# Patient Record
Sex: Female | Born: 1980 | Race: White | Hispanic: No | Smoking: Current some day smoker
Health system: Southern US, Community
[De-identification: ages and names within clinical notes are randomized; demographics above are authoritative.]

## PROBLEM LIST (undated history)

## (undated) DIAGNOSIS — F419 Anxiety disorder, unspecified: Secondary | ICD-10-CM

## (undated) HISTORY — DX: Anxiety disorder, unspecified: F41.9

---

## 2003-10-07 ENCOUNTER — Emergency Department (HOSPITAL_COMMUNITY): Admission: EM | Admit: 2003-10-07 | Discharge: 2003-10-07 | Payer: Self-pay | Admitting: Emergency Medicine

## 2005-12-14 ENCOUNTER — Other Ambulatory Visit: Admission: RE | Admit: 2005-12-14 | Discharge: 2005-12-14 | Payer: Self-pay | Admitting: Obstetrics and Gynecology

## 2006-04-11 ENCOUNTER — Inpatient Hospital Stay (HOSPITAL_COMMUNITY): Admission: AD | Admit: 2006-04-11 | Discharge: 2006-04-11 | Payer: Self-pay | Admitting: Obstetrics and Gynecology

## 2006-06-25 ENCOUNTER — Inpatient Hospital Stay (HOSPITAL_COMMUNITY): Admission: AD | Admit: 2006-06-25 | Discharge: 2006-06-28 | Payer: Self-pay | Admitting: Obstetrics and Gynecology

## 2006-07-04 ENCOUNTER — Encounter: Admission: RE | Admit: 2006-07-04 | Discharge: 2006-08-03 | Payer: Self-pay | Admitting: Obstetrics and Gynecology

## 2006-08-03 ENCOUNTER — Other Ambulatory Visit: Admission: RE | Admit: 2006-08-03 | Discharge: 2006-08-03 | Payer: Self-pay | Admitting: Obstetrics and Gynecology

## 2006-08-04 ENCOUNTER — Encounter: Admission: RE | Admit: 2006-08-04 | Discharge: 2006-08-12 | Payer: Self-pay | Admitting: Obstetrics and Gynecology

## 2006-10-27 ENCOUNTER — Emergency Department (HOSPITAL_COMMUNITY): Admission: EM | Admit: 2006-10-27 | Discharge: 2006-10-27 | Payer: Self-pay | Admitting: Emergency Medicine

## 2008-02-14 ENCOUNTER — Inpatient Hospital Stay (HOSPITAL_COMMUNITY): Admission: AD | Admit: 2008-02-14 | Discharge: 2008-02-14 | Payer: Self-pay | Admitting: Obstetrics and Gynecology

## 2008-06-01 ENCOUNTER — Inpatient Hospital Stay (HOSPITAL_COMMUNITY): Admission: AD | Admit: 2008-06-01 | Discharge: 2008-06-01 | Payer: Self-pay | Admitting: Obstetrics and Gynecology

## 2008-07-09 ENCOUNTER — Inpatient Hospital Stay (HOSPITAL_COMMUNITY): Admission: RE | Admit: 2008-07-09 | Discharge: 2008-07-11 | Payer: Self-pay | Admitting: Obstetrics and Gynecology

## 2008-07-12 ENCOUNTER — Encounter: Admission: RE | Admit: 2008-07-12 | Discharge: 2008-08-11 | Payer: Self-pay | Admitting: Obstetrics and Gynecology

## 2008-08-12 ENCOUNTER — Encounter: Admission: RE | Admit: 2008-08-12 | Discharge: 2008-09-10 | Payer: Self-pay | Admitting: Obstetrics and Gynecology

## 2010-03-03 LAB — HM PAP SMEAR

## 2011-02-15 ENCOUNTER — Encounter: Payer: Self-pay | Admitting: Internal Medicine

## 2011-02-15 ENCOUNTER — Ambulatory Visit (INDEPENDENT_AMBULATORY_CARE_PROVIDER_SITE_OTHER): Payer: 59 | Admitting: Internal Medicine

## 2011-02-15 VITALS — BP 130/80 | HR 72 | Ht 66.0 in | Wt 194.0 lb

## 2011-02-15 DIAGNOSIS — R41840 Attention and concentration deficit: Secondary | ICD-10-CM

## 2011-02-15 DIAGNOSIS — F17200 Nicotine dependence, unspecified, uncomplicated: Secondary | ICD-10-CM

## 2011-02-15 DIAGNOSIS — Z720 Tobacco use: Secondary | ICD-10-CM

## 2011-02-15 DIAGNOSIS — F411 Generalized anxiety disorder: Secondary | ICD-10-CM

## 2011-02-15 DIAGNOSIS — F419 Anxiety disorder, unspecified: Secondary | ICD-10-CM | POA: Insufficient documentation

## 2011-02-15 DIAGNOSIS — R6889 Other general symptoms and signs: Secondary | ICD-10-CM

## 2011-02-15 DIAGNOSIS — F4322 Adjustment disorder with anxiety: Secondary | ICD-10-CM

## 2011-02-15 DIAGNOSIS — Z23 Encounter for immunization: Secondary | ICD-10-CM

## 2011-02-15 NOTE — Progress Notes (Signed)
Subjective:    Patient ID: Tara Thornton, female    DOB: 05/28/1981, 30 y.o.   MRN: [id number]  HPI Patient comes in today for  New patient visit. orig from  [place]  nd here for 10 years.  Divorced  March 2012sep Feb 2011  Is concerned about anxiety and concentration issues. It has gotten so bad that the people at work have suggested that she get some evaluation.  Anxiety/ fatigue hard to sleep and cant focus and  Losing things and distracted  .    Not  Productive ; Usually gets a lot done and  Now cant focus.   Noticing this the worse in the past 3-4 month always  has had this tendency and now interfering.  Did an on line test And  Showed adhd.  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 Tends to do better in a crisis  then on routine things.   Has been on a d prescription for  Anxiety/   cymbalta last  Year when she went to [facility]   [facility]  ? Dr Hyacinth Meeker one time. Had a hard time adapting and had " Vomited evey day "and gave her dry mouth   Eventually this  resolve and then helped   Her anxiety but had to stop for cost reasons.after   6-8 weeks.  She didn't go back because her insurance wouldn't pay for it.    Didn't go back and took for 2 months and had se with this.    And couldn't afford so stopped taking it .  No depression sx.  School :   Hard at [employer]  Didn't do well there was  for [employer].  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   Contraception : And weight gain and 30  implanon  And out for 2 weeks.  Had  Lab tests.  Done  including thyroid tests that were apparently normal..  Usually functions better under stress  And harder challenge    Best friend . died. A few months ago from  Leukemia   38 . Hospice   A first major loss but coping and open about this. Family history:   Mom depression   Mom hx of etoh and drug abuse when younger  MGM  etoh and  PgM  Not normal. .   Sis has been on  meds for depression and then put on VYvanse and wellbutrin with help   .  MGF ? Mental illness   .Father had ?   LD.   W As in [employer].   Pharmacist, community but  Had a hard time in school. "   Review of Systems Negative chest pain shortness of breath fever vision hearing change. New joint pains did strain her left foot on an elliptical machine recently. No bruising bleeding unusual headaches vision changes. Neg CV PULM Gi GU sx  Currently. Rest of ros negative.    Objective:   Physical Exam Well-developed well-nourished in no acute distress speaks a bit rapidly coherent articulate sometimes distractible no excess motor movement or tremor HEENT: Normocephalic ;atraumatic , Eyes;  PERRL, EOMs  Full, lids and conjunctiva clear,,Ears: no deformities, canals nl, TM landmarks normal, Nose: no deformity or discharge  Mouth : OP clear without lesion or edema . Chest:  Clear to A&P without wheezes rales or rhonchi CV:  S1-S2 no  gallops or murmurs peripheral perfusion is normal Neck supple no mass or LN Abdomen:  Sof,t normal bowel sounds without hepatosplenomegaly, no guarding rebound or masses no CVA tenderness MSNo clubbing cyanosis or edema   slight swelling behind left foot. No bony tenderness Neuro: oriented x 3   Cn intact dtrs nl, nl gait  Skin clear no unusual rashes      Assessment & Plan:  Focusing problem Multi causes .  Could have underlying ADD predating the other events but very many external factors contributing. Difficult to decide which medication treatment plan would be best for her. Discussed this with her. She feels a bit frustrated because she has so little time  She goes to med care infrequently  and people have  encouraged her to get intervention help. Wants to avoid weight gaining meds also.  Has a fam hx of depression and poss add( sister)  ? Not dx in others . No subs abuse contributing  Sleep deprivation ,  2 small children and hectic schedule  External factors   Contribute also Anxiety reactive.  Denies depressive sx .  Disc grieving model contributing also but seems appropriate for situation. Get copy of labs sent. Tobacco minimal  :rec dc  HCM tdap today   Updated.  Consider get varicella serology with next blood tests.    Contacted Dr Dub Mikes office and they can do entry appt  In 2 days on Thursday this week. Encouraged to follow through  Despite her schedule.    Total visit > 50% spent counseling and coordinating care

## 2011-02-15 NOTE — H&P (Signed)
Tara Thornton, Tara Thornton           ACCOUNT NO.:  [id number]   MEDICAL RECORD NO.:  [id number]          PATIENT TYPE:  MAT   LOCATION:  [facility]                          FACILITY:  [facility]   PHYSICIAN:  Crist Fat. Rivard, M.D. DATE OF BIRTH:  01/17/1981   DATE OF ADMISSION:  06/01/2008  DATE OF DISCHARGE:  06/01/2008                              HISTORY & PHYSICAL   Ms. Tara Thornton is a 30 year old gravida 2, para 1-0-0-1 at 40 weeks who  presents for induction secondary to history of LGA infant and severe low  back and leg pain.  Pregnancy has been remarkable for:  1. First trimester spotting.  2. Rh negative.  3. Questionable LMP.  4. History of LGA infant last pregnancy.  5. Group B strep negative.   PRENATAL LABS:  Blood type is O negative, Rh antibody negative, urine  nonreactive, rubella titer positive, hepatitis B surface antigen  negative, HIV was nonreactive, cystic fibrosis testing was negative.  Pap was done in March and was within normal limits.  The patient had a  first trimester screen that was normal.  Hemoglobin upon entering the  practice was 13.4.  It was within normal limits at 27 weeks.  The  patient had a Glucola at 19 weeks that was normal.  She had a repeat at  29 weeks that was also negative.  Group B strep culture was negative at  36 weeks.   HISTORY OF PRESENT PREGNANCY:  The patient entered care at approximately  10 weeks.  She had an ultrasound that day for dating which gave an [facility]  of July 09, 2008.  She planned first trimester screening.  She did  have some first trimester spotting and received Rhophylac at  approximately 10 weeks.  She had an early Glucola at 19 weeks secondary  to history of LGA infant.  This was normal and 113.  She had another  ultrasound at 19 weeks showing normal growth and normal cervical length.  She began to have some sciatica 23 weeks.  She had another Glucola at 27  weeks that was normal at 105.  She received a prescription for  RhoGAM at  that time.  At 35 weeks she was of having more back pain.  She was  placed on Ambien for comfort.  By 37 weeks, she was having more  persistent low back pain and leg pain with numbness of legs and edema.  There was minimal improvement with physical therapy.  She was taken out  of work at that time.  Ultrasound showed a fetus vertex with 7 pounds at  78th percentile.  Cervix at that time was closed 50% vertex, -2.  She  was placed on Tylenol #3.  At 38 weeks, she was continuing to have the  same issues with back and leg pain.  Group B strep was negative.  No  change in her cervix.  Plan was made for induction on July 09, 2008.   OBSTETRICAL HISTORY:  In 2007, she had a vaginal birth of a female infant  weight 9 pounds 9 ounces at 40-5/7 weeks.  She was in labor 34  hours.  She had epidural anesthesia.  She was induced for that delivery.  She  did receive RhoGAM during that pregnancy.   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  She had a  colposcopy in April 2007.  Paps have been normal since then.  She had  been previously on Mircette.  She reports usual childhood illnesses.  She has a history of 1 UTI in the past.   SURGICAL HISTORY:  Includes wisdom teeth removed in 2002.  Her only  other hospitalization was for childbirth.   ALLERGIES:  SEAFOOD, which causes rash and vomiting.   FAMILY HISTORY:  Mother and father have hypertension.  Her mother and  sister have varicosities.  Her maternal grandmother had lung cancer.  Her mother had thyroid problems.  Maternal grandmother had lung cancer.  There is depression in the family.  Her mother and sister are smokers.   GENETIC HISTORY:  Remarkable for the patient's maternal grandmother is a  twin.   SOCIAL HISTORY:  The patient is single.  The father of the baby is  involved and supportive.  His name is Ariell Gunnels.  The patient has 3  years of college.  She is employed in Engineering geologist.  Her partner has a  Systems analyst.  He is a IT sales professional.  She has been followed by the Physician  Service at [facility].  She denies any alcohol, drug or tobacco  use during this pregnancy.  She is Caucasian and of [place] faith.   PHYSICAL EXAM:  VITAL SIGNS:  Stable.  The patient is febrile.  HEENT:  Within normal limits.  LUNGS:  Breath sounds are clear.  HEART:  Regular rate and rhythm without murmur.  BREASTS:  Soft and nontender.  ABDOMEN:  Fundal height is approximately 38 cm.  Estimated fetal weight  is 7-8 pounds.  Uterine contractions are occasional and mild per patient  report.  PELVIC:  Exam is deferred at present.  Fetal heart rate is in the 140s  by Doppler.  EXTREMITIES:  Deep tendon reflexes are 2+ without clonus.  There is  trace edema noted.   IMPRESSION:  1. Intrauterine pregnancy at 40 weeks.  2. Severe sciatica and back pain.  3. Negative group B strep.  4. History of large-for-gestational-age infant.   PLAN:  1. Admit to birthing suite for consult with Dr. Silverio Lay as      attending physician.  2. Routine physician induction orders per Dr. Estanislado Pandy.  3. Pain medication p.r.n.      Renaldo Reel Emilee Hero, C.N.M.      Crist Fat Rivard, M.D.  Electronically Signed    VLL/MEDQ  D:  07/08/2008  T:  07/08/2008  Job:  [id number]

## 2011-02-15 NOTE — Patient Instructions (Addendum)
Sleep deprivation Stress Grief anxiety can easily interfere with short term memory.    Refer  For psych eval asap for med management  Some ADHD meds make anxiety worse. Keep appt with Dr Ree Shay office .

## 2011-07-04 LAB — CBC
HCT: 31.1 — ABNORMAL LOW
Hemoglobin: 10.3 — ABNORMAL LOW
Hemoglobin: 9.5 — ABNORMAL LOW
MCHC: 33.2
Platelets: 242
RBC: 3.27 — ABNORMAL LOW
RBC: 3.59 — ABNORMAL LOW
RDW: 13.9
WBC: 10.3
WBC: 13.2 — ABNORMAL HIGH

## 2011-07-04 LAB — RPR: RPR Ser Ql: NONREACTIVE

## 2011-07-04 LAB — CCBB MATERNAL DONOR DRAW

## 2011-07-04 LAB — RH IMMUNE GLOB WKUP(>/=20WKS)(NOT WOMEN'S HOSP): Fetal Screen: NEGATIVE

## 2011-07-12 ENCOUNTER — Telehealth: Payer: Self-pay | Admitting: *Deleted

## 2011-07-12 NOTE — Telephone Encounter (Signed)
Per Dr. Fabian Sharp- need a note from md's office about diagnosis and what her treatment is before her appt. Can get refills from them until we can work her in.

## 2011-07-12 NOTE — Telephone Encounter (Signed)
Pt wants to schedule an appt before the end of the month for a follow up on anxiety, panic attacks and not sleeping at night. Pt has seen a specialist in the past but they don't take her ins. Where can we put her in and do you want 15 or 30 mins for this patient?

## 2011-07-13 NOTE — Telephone Encounter (Signed)
Left message on machine to have md fax over notes for [place] to review so we can find a spot to work her in.

## 2011-07-28 ENCOUNTER — Ambulatory Visit (INDEPENDENT_AMBULATORY_CARE_PROVIDER_SITE_OTHER): Payer: 59 | Admitting: Internal Medicine

## 2011-07-28 ENCOUNTER — Encounter: Payer: Self-pay | Admitting: Internal Medicine

## 2011-07-28 DIAGNOSIS — F411 Generalized anxiety disorder: Secondary | ICD-10-CM

## 2011-07-28 DIAGNOSIS — T887XXA Unspecified adverse effect of drug or medicament, initial encounter: Secondary | ICD-10-CM

## 2011-07-28 DIAGNOSIS — F419 Anxiety disorder, unspecified: Secondary | ICD-10-CM

## 2011-07-28 MED ORDER — BUPROPION HCL ER (XL) 150 MG PO TB24: ORAL_TABLET | ORAL | Status: DC

## 2011-07-28 NOTE — Patient Instructions (Signed)
Begin wellbutrin 150xl 1 a day for a week then increase toe 300 mg per day .  Call for refill when at end of this med and will change to 300 mg xl .  Then will plan rov in 6-8 weeks unless you are seeing Dr Ree Shay office.

## 2011-07-28 NOTE — Progress Notes (Signed)
  Subjective:    Patient ID: Tara Thornton, female    DOB: November 08, 1980, 30 y.o.   MRN: [id number]  HPI Comes in for anciwty and medication r .  See last ove.  Was seen by Alden Hipp neal in Dr Dub Mikes office  Feb 17 2011 who began lexapro . They are currently now not on her insurance panel and needs other help until they get back on panel . Had rx of 2 months of lexapro    And may have been aggravating sleep disturbance  .     Was up to 20 mg for about 6-8 weeks.   The longer she took  It the  More she  Felt  anxious wound tight and chest tightness. Fu has been delayed by scheduling  and  Insuranse issue.   Sleep is disturbed and  hard to breath.    No new sx .      Struggling with weight gain .Hard to lose weight.  Had  Weight gain with implanon.  in the past.   Rare use of  tobacco .  Etoh.  Minimal.  Coffee    [employer]   220.calories at times  Hx of wellbutrin  Use that seems to have helped her in the apst and wants to try this. Currently no acute panic attacks . Schedule is  Busy with her children and work.  Review of Systems ROS:  GEN/ HEENTNo fever,  sweats headaches vision problems hearing changes, CV/ PULM; No chest pain shortness of breath cough, syncope,edema  change in exercise tolerance. GI /GU: No adominal pain, vomiting, change in bowel habits. No blood in the stool. No significant GU symptoms. SKIN/HEME: ,no acute skin rashes suspicious lesions or bleeding. No lymphadenopathy, nodules, masses.  NEURO/ PSYCH:  No neurologic signs such as weakness numbness IMM/ Allergy: No unusual infections.  Allergy .   REST of 12 system review negative Past history family history social history reviewed in the electronic medical record. Reviewed note from Dr Ree Shay office Has had labs done with nl thyroid in the past     Objective:   Physical Exam WDWN in nad  Neck: Supple without adenopathy or masses or bruits Oriented x 3. Normal cognition, attention, speech. Notdepressed appearing    Good eye contact . mildly anxious here with small child . Appropriate interaction     Assessment & Plan:  Anxiety   Hx of side effects of meds    Disc options   Risk benefit of medication discussed.  Ok to ry wellbutrin cause of past  exposures but usually doesn't help anxiety as much as some other options.  Will follow consider a snri . Pt willing to go back to psych office if gets back in insurance network.  Total visit > 50% spent counseling and coordinating care

## 2011-07-30 ENCOUNTER — Encounter: Payer: Self-pay | Admitting: Internal Medicine

## 2011-07-30 DIAGNOSIS — T887XXA Unspecified adverse effect of drug or medicament, initial encounter: Secondary | ICD-10-CM | POA: Insufficient documentation

## 2011-09-28 ENCOUNTER — Other Ambulatory Visit: Payer: Self-pay | Admitting: Internal Medicine

## 2012-01-10 ENCOUNTER — Other Ambulatory Visit: Payer: Self-pay | Admitting: Internal Medicine

## 2012-01-10 NOTE — Telephone Encounter (Signed)
Pt last seen 07/28/11.  Rx last filled 09/28/11 #60x 1 rf.  Pls advise.

## 2012-01-10 NOTE — Telephone Encounter (Signed)
havent seen her since October. Please ask if she has gone back to psych or getting refills elsewhere.  If not can refill enough for a month and get OV before runs out.

## 2012-01-12 NOTE — Telephone Encounter (Signed)
Left a message for pt to return call 

## 2012-01-13 NOTE — Telephone Encounter (Signed)
Pt states the psych person she was seeing stopped taking her insurance and recently filled her medication for the last time.  Pt states she will make a f/u appt before medication runs out.

## 2013-02-06 ENCOUNTER — Telehealth: Payer: Self-pay | Admitting: *Deleted

## 2013-02-06 NOTE — Telephone Encounter (Signed)
Error wrong patient chart

## 2013-10-11 ENCOUNTER — Ambulatory Visit (INDEPENDENT_AMBULATORY_CARE_PROVIDER_SITE_OTHER): Payer: 59 | Admitting: Licensed Clinical Social Worker

## 2013-10-11 DIAGNOSIS — F411 Generalized anxiety disorder: Secondary | ICD-10-CM

## 2013-10-11 DIAGNOSIS — F331 Major depressive disorder, recurrent, moderate: Secondary | ICD-10-CM

## 2013-10-15 ENCOUNTER — Ambulatory Visit: Payer: 59 | Admitting: Licensed Clinical Social Worker

## 2014-08-04 ENCOUNTER — Encounter: Payer: Self-pay | Admitting: Internal Medicine

## 2016-05-13 ENCOUNTER — Other Ambulatory Visit: Payer: Self-pay | Admitting: Orthopedic Surgery

## 2016-05-13 DIAGNOSIS — M5136 Other intervertebral disc degeneration, lumbar region: Secondary | ICD-10-CM

## 2016-05-23 ENCOUNTER — Ambulatory Visit
Admission: RE | Admit: 2016-05-23 | Discharge: 2016-05-23 | Disposition: A | Discharge status: Home / Self Care | Payer: 59 | Source: Ambulatory Visit | Attending: Orthopedic Surgery | Admitting: Orthopedic Surgery

## 2016-05-23 ENCOUNTER — Ambulatory Visit
Admission: RE | Admit: 2016-05-23 | Discharge: 2016-05-23 | Disposition: A | Discharge status: Home / Self Care | Payer: Self-pay | Source: Ambulatory Visit | Attending: Orthopedic Surgery | Admitting: Orthopedic Surgery

## 2016-05-23 ENCOUNTER — Other Ambulatory Visit: Payer: Self-pay | Admitting: Orthopedic Surgery

## 2016-05-23 DIAGNOSIS — M5136 Other intervertebral disc degeneration, lumbar region: Secondary | ICD-10-CM

## 2016-05-23 MED ORDER — DIAZEPAM 5 MG PO TABS
10.0000 mg | ORAL_TABLET | Freq: Once | ORAL | Status: AC
  Administered 2016-05-23: 10 mg via ORAL

## 2016-05-23 MED ORDER — MEPERIDINE HCL 100 MG/ML IJ SOLN
75.0000 mg | Freq: Once | INTRAMUSCULAR | Status: AC
  Administered 2016-05-23: 75 mg via INTRAMUSCULAR

## 2016-05-23 MED ORDER — IOPAMIDOL (ISOVUE-M 200) INJECTION 41%
15.0000 mL | Freq: Once | INTRAMUSCULAR | Status: AC
  Administered 2016-05-23: 15 mL via INTRATHECAL

## 2016-05-23 MED ORDER — ONDANSETRON HCL 4 MG/2ML IJ SOLN
4.0000 mg | Freq: Once | INTRAMUSCULAR | Status: AC
  Administered 2016-05-23: 4 mg via INTRAMUSCULAR

## 2016-05-23 NOTE — Progress Notes (Signed)
Dr. Benard Rinkcurnes in to speak to pt before discharge.

## 2016-05-23 NOTE — Progress Notes (Signed)
Pt states she has been off Wellbutrin and Adderall for the past 2 days.

## 2016-05-23 NOTE — Discharge Instructions (Signed)
Myelogram Discharge Instructions  1. Go home and rest quietly for the next 24 hours.  It is important to lie flat for the next 24 hours.  Get up only to go to the restroom.  You may lie in the bed or on a couch on your back, your stomach, your left side or your right side.  You may have one pillow under your head.  You may have pillows between your knees while you are on your side or under your knees while you are on your back.  2. DO NOT drive today.  Recline the seat as far back as it will go, while still wearing your seat belt, on the way home.  3. You may get up to go to the bathroom as needed.  You may sit up for 10 minutes to eat.  You may resume your normal diet and medications unless otherwise indicated.  Drink lots of extra fluids today and tomorrow.  4. The incidence of headache, nausea, or vomiting is about 5% (one in 20 patients).  If you develop a headache, lie flat and drink plenty of fluids until the headache goes away.  Caffeinated beverages may be helpful.  If you develop severe nausea and vomiting or a headache that does not go away with flat bed rest, call [phone].  5. You may resume normal activities after your 24 hours of bed rest is over; however, do not exert yourself strongly or do any heavy lifting tomorrow. If when you get up you have a headache when standing, go back to bed and force fluids for another 24 hours.  6. Call your physician for a follow-up appointment.  The results of your myelogram will be sent directly to your physician by the following day.  7. If you have any questions or if complications develop after you arrive home, please call [phone].  Discharge instructions have been explained to the patient.  The patient, or the person responsible for the patient, fully understands these instructions.       May resume Adderall and Wellbutrin on Aug. 22, 2017, after 9:30 am.

## 2016-05-25 ENCOUNTER — Telehealth: Payer: Self-pay

## 2016-05-25 NOTE — Telephone Encounter (Signed)
Spoke with patient in follow-up after her myelogram here 05/23/16.  She reports no problems from the procedure or recovery.  jkl

## 2016-05-27 ENCOUNTER — Ambulatory Visit
Admission: RE | Admit: 2016-05-27 | Discharge: 2016-05-27 | Disposition: A | Discharge status: Home / Self Care | Payer: 59 | Source: Ambulatory Visit | Attending: Orthopedic Surgery | Admitting: Orthopedic Surgery

## 2016-05-27 ENCOUNTER — Other Ambulatory Visit: Payer: Self-pay | Admitting: Orthopedic Surgery

## 2016-05-27 DIAGNOSIS — M4157 Other secondary scoliosis, lumbosacral region: Secondary | ICD-10-CM

## 2016-05-27 MED ORDER — IOPAMIDOL (ISOVUE-300) INJECTION 61%
100.0000 mL | Freq: Once | INTRAVENOUS | Status: AC | PRN
  Administered 2016-05-27: 100 mL via INTRAVENOUS

## 2019-02-11 ENCOUNTER — Emergency Department (HOSPITAL_COMMUNITY)
Admission: EM | Admit: 2019-02-11 | Discharge: 2019-02-12 | Disposition: A | Discharge status: Home / Self Care | Payer: Self-pay | Attending: Emergency Medicine | Admitting: Emergency Medicine

## 2019-02-11 ENCOUNTER — Encounter (HOSPITAL_COMMUNITY): Payer: Self-pay

## 2019-02-11 ENCOUNTER — Other Ambulatory Visit: Payer: Self-pay

## 2019-02-11 DIAGNOSIS — N10 Acute tubulo-interstitial nephritis: Secondary | ICD-10-CM | POA: Insufficient documentation

## 2019-02-11 DIAGNOSIS — Z79899 Other long term (current) drug therapy: Secondary | ICD-10-CM | POA: Insufficient documentation

## 2019-02-11 DIAGNOSIS — N12 Tubulo-interstitial nephritis, not specified as acute or chronic: Secondary | ICD-10-CM

## 2019-02-11 DIAGNOSIS — F17200 Nicotine dependence, unspecified, uncomplicated: Secondary | ICD-10-CM | POA: Insufficient documentation

## 2019-02-11 LAB — CBC
HCT: 41.9 % (ref 36.0–46.0)
Hemoglobin: 14 g/dL (ref 12.0–15.0)
MCH: 31.5 pg (ref 26.0–34.0)
MCHC: 33.4 g/dL (ref 30.0–36.0)
MCV: 94.2 fL (ref 80.0–100.0)
Platelets: 297 10*3/uL (ref 150–400)
RBC: 4.45 MIL/uL (ref 3.87–5.11)
RDW: 12.2 % (ref 11.5–15.5)
WBC: 17.7 10*3/uL — ABNORMAL HIGH (ref 4.0–10.5)
nRBC: 0 % (ref 0.0–0.2)

## 2019-02-11 LAB — URINALYSIS, ROUTINE W REFLEX MICROSCOPIC
Bilirubin Urine: NEGATIVE
Glucose, UA: NEGATIVE mg/dL
Ketones, ur: 15 mg/dL — AB
Nitrite: POSITIVE — AB
Protein, ur: 30 mg/dL — AB
Specific Gravity, Urine: 1.02 (ref 1.005–1.030)
pH: 5.5 (ref 5.0–8.0)

## 2019-02-11 LAB — COMPREHENSIVE METABOLIC PANEL
ALT: 15 U/L (ref 0–44)
AST: 16 U/L (ref 15–41)
Albumin: 4.1 g/dL (ref 3.5–5.0)
Alkaline Phosphatase: 71 U/L (ref 38–126)
Anion gap: 11 (ref 5–15)
BUN: 10 mg/dL (ref 6–20)
CO2: 25 mmol/L (ref 22–32)
Calcium: 9.4 mg/dL (ref 8.9–10.3)
Chloride: 102 mmol/L (ref 98–111)
Creatinine, Ser: 1.02 mg/dL — ABNORMAL HIGH (ref 0.44–1.00)
GFR calc Af Amer: >60 mL/min (ref >60)
GFR calc non Af Amer: >60 mL/min (ref >60)
Glucose, Bld: 101 mg/dL — ABNORMAL HIGH (ref 70–99)
Potassium: 4 mmol/L (ref 3.5–5.1)
Sodium: 138 mmol/L (ref 135–145)
Total Bilirubin: 0.5 mg/dL (ref 0.3–1.2)
Total Protein: 7.6 g/dL (ref 6.5–8.1)

## 2019-02-11 LAB — I-STAT BETA HCG BLOOD, ED (MC, WL, AP ONLY): I-stat hCG, quantitative: <5 m[IU]/mL (ref <5)

## 2019-02-11 LAB — URINALYSIS, MICROSCOPIC (REFLEX)

## 2019-02-11 LAB — LIPASE, BLOOD: Lipase: 26 U/L (ref 11–51)

## 2019-02-11 MED ORDER — SODIUM CHLORIDE 0.9% FLUSH
3.0000 mL | Freq: Once | INTRAVENOUS | Status: AC
  Administered 2019-02-12: 02:00:00 3 mL via INTRAVENOUS

## 2019-02-11 MED ORDER — OXYCODONE-ACETAMINOPHEN 5-325 MG PO TABS
1.0000 | ORAL_TABLET | Freq: Once | ORAL | Status: AC
  Administered 2019-02-11: 22:00:00 1 via ORAL
  Filled 2019-02-11: qty 1

## 2019-02-11 NOTE — ED Triage Notes (Signed)
Pt sent by pcp for possible pyelonephritis hx of same about 5 years ago. Pt having right sided flank pain. Urine darker than normal

## 2019-02-12 ENCOUNTER — Emergency Department (HOSPITAL_COMMUNITY): Payer: Self-pay

## 2019-02-12 LAB — LACTIC ACID, PLASMA: Lactic Acid, Venous: 0.9 mmol/L (ref 0.5–1.9)

## 2019-02-12 MED ORDER — OXYCODONE-ACETAMINOPHEN 5-325 MG PO TABS: 1.0000 | ORAL_TABLET | Freq: Four times a day (QID) | ORAL | 0 refills | Status: AC | PRN

## 2019-02-12 MED ORDER — ONDANSETRON 4 MG PO TBDP: 4.0000 mg | ORAL_TABLET | Freq: Three times a day (TID) | ORAL | 0 refills | Status: AC | PRN

## 2019-02-12 MED ORDER — MORPHINE SULFATE (PF) 4 MG/ML IV SOLN
4.0000 mg | Freq: Once | INTRAVENOUS | Status: AC
  Administered 2019-02-12: 4 mg via INTRAVENOUS
  Filled 2019-02-12: qty 1

## 2019-02-12 MED ORDER — KETOROLAC TROMETHAMINE 30 MG/ML IJ SOLN
30.0000 mg | Freq: Once | INTRAMUSCULAR | Status: AC
  Administered 2019-02-12: 03:00:00 30 mg via INTRAVENOUS
  Filled 2019-02-12: qty 1

## 2019-02-12 MED ORDER — CEPHALEXIN 500 MG PO CAPS: 500.0000 mg | ORAL_CAPSULE | Freq: Three times a day (TID) | ORAL | 0 refills | Status: AC

## 2019-02-12 MED ORDER — IBUPROFEN 600 MG PO TABS: 600.0000 mg | ORAL_TABLET | Freq: Four times a day (QID) | ORAL | 0 refills | Status: AC | PRN

## 2019-02-12 MED ORDER — SODIUM CHLORIDE 0.9 % IV SOLN
1.0000 g | Freq: Once | INTRAVENOUS | Status: AC
  Administered 2019-02-12: 1 g via INTRAVENOUS
  Filled 2019-02-12: qty 10

## 2019-02-12 MED ORDER — SODIUM CHLORIDE 0.9 % IV BOLUS
1000.0000 mL | Freq: Once | INTRAVENOUS | Status: AC
  Administered 2019-02-12: 1000 mL via INTRAVENOUS

## 2019-02-12 MED ORDER — OXYCODONE-ACETAMINOPHEN 5-325 MG PO TABS
1.0000 | ORAL_TABLET | Freq: Once | ORAL | Status: AC
  Administered 2019-02-12: 1 via ORAL
  Filled 2019-02-12: qty 1

## 2019-02-12 MED ORDER — ONDANSETRON HCL 4 MG/2ML IJ SOLN
4.0000 mg | Freq: Once | INTRAMUSCULAR | Status: AC
  Administered 2019-02-12: 4 mg via INTRAVENOUS
  Filled 2019-02-12: qty 2

## 2019-02-12 NOTE — ED Provider Notes (Signed)
[facility]   CSN: [id number] Arrival date & time: 02/11/19  2128    History   Chief Complaint Chief Complaint  Patient presents with   Flank Pain    HPI Tara Thornton is a 38 y.o. female.     HPI  This is a 38 year old female with a history of pyelonephritis who presents with right flank pain and fever.  Patient reports that she has onset of symptoms yesterday.  She reports chills last night and right-sided back pain that is nonradiating.  She thought it was her herniated disc.  She does report some urgency but no dysuria or hematuria.  No known history of kidney stones.  Temperature this morning was 100.7.  She reports chills and nausea.  No vomiting.  She has also had some right lower quadrant dull pain that is nonradiating.  She rates her pain at 7 out of 10 prior to receiving Percocet in triage.  He denies any upper respiratory symptoms including cough, chest pain, shortness of breath.  No known COVID contacts.  Past Medical History:  Diagnosis Date   Anxiety     Patient Active Problem List   Diagnosis Date Noted   Medication side effect 07/30/2011   Anxiety 02/15/2011   Adjustment disorder with anxious mood 02/15/2011   Disturbed concentration 02/15/2011   Tobacco user 02/15/2011    History reviewed. No pertinent surgical history.   OB History    Gravida  2   Para  2   Term      Preterm      AB      Living        SAB      TAB      Ectopic      Multiple      Live Births               Home Medications    Prior to Admission medications   Medication Sig Start Date End Date Taking? Authorizing Provider  amphetamine-dextroamphetamine (ADDERALL XR) 10 MG 24 hr capsule Take 30 mg by mouth daily.    Yes [provider]  buPROPion (WELLBUTRIN XL) 150 MG 24 hr tablet TAKE 1 BY MOUTH ONCE DAILY FOR A WEEK THEN INCREASE TO 2 DAILY OR AS DIRECTED Patient taking  differently: Take 300 mg by mouth daily.  01/10/12  Yes Panosh, Neta MendsWanda K, MD  topiramate (TOPAMAX) 25 MG tablet Take 50 mg by mouth 2 (two) times daily.   Yes [provider]  cephALEXin (KEFLEX) 500 MG capsule Take 1 capsule (500 mg total) by mouth 3 (three) times daily. 02/12/19   Bentlie Catanzaro, Mayer Maskerourtney F, MD  ibuprofen (ADVIL) 600 MG tablet Take 1 tablet (600 mg total) by mouth every 6 (six) hours as needed. 02/12/19   Wm Fruchter, Mayer Maskerourtney F, MD  ondansetron (ZOFRAN ODT) 4 MG disintegrating tablet Take 1 tablet (4 mg total) by mouth every 8 (eight) hours as needed for nausea or vomiting. 02/12/19   Tiann Saha, Mayer Maskerourtney F, MD  oxyCODONE-acetaminophen (PERCOCET/ROXICET) 5-325 MG tablet Take 1 tablet by mouth every 6 (six) hours as needed for severe pain. 02/12/19   Yachet Mattson, Mayer Maskerourtney F, MD    Family History Family History  Problem Relation Age of Onset   Hypertension Mother    Fibromyalgia Mother    Alcohol abuse Mother        an other susbstances   Hypertension Father    Depression Other  parent ;sib ? add    Social History Social History   Tobacco Use   Smoking status: Current Some Day Smoker  Substance Use Topics   Alcohol use: No   Drug use: No     Allergies   Shellfish-derived products   Review of Systems Review of Systems  Constitutional: Positive for chills and fever.  Respiratory: Negative for cough, chest tightness and shortness of breath.   Cardiovascular: Negative for chest pain.  Gastrointestinal: Positive for abdominal pain and nausea. Negative for constipation, diarrhea and vomiting.  Genitourinary: Positive for flank pain and urgency.  All other systems reviewed and are negative.    Physical Exam Updated Vital Signs BP 119/78 (BP Location: Right Arm)    Pulse (!) 118    Temp 98.7 F (37.1 C) (Oral)    Resp 16    SpO2 99%   Physical Exam Vitals signs and nursing note reviewed.  Constitutional:      Appearance: She is well-developed. She is not  ill-appearing.  HENT:     Head: Normocephalic and atraumatic.  Eyes:     Pupils: Pupils are equal, round, and reactive to light.  Neck:     Musculoskeletal: Neck supple.  Cardiovascular:     Rate and Rhythm: Regular rhythm. Tachycardia present.     Heart sounds: Normal heart sounds.  Pulmonary:     Effort: Pulmonary effort is normal. No respiratory distress.     Breath sounds: No wheezing.  Abdominal:     General: Bowel sounds are normal.     Palpations: Abdomen is soft.     Tenderness: There is abdominal tenderness. There is right CVA tenderness. There is no left CVA tenderness.  Musculoskeletal:     Right lower leg: No edema.     Left lower leg: No edema.  Skin:    General: Skin is warm and dry.  Neurological:     Mental Status: She is alert and oriented to person, place, and time.  Psychiatric:        Mood and Affect: Mood normal.      ED Treatments / Results  Labs (all labs ordered are listed, but only abnormal results are displayed) Labs Reviewed  COMPREHENSIVE METABOLIC PANEL - Abnormal; Notable for the following components:      Result Value   Glucose, Bld 101 (*)    Creatinine, Ser 1.02 (*)    All other components within normal limits  CBC - Abnormal; Notable for the following components:   WBC 17.7 (*)    All other components within normal limits  URINALYSIS, ROUTINE W REFLEX MICROSCOPIC - Abnormal; Notable for the following components:   Hgb urine dipstick SMALL (*)    Ketones, ur 15 (*)    Protein, ur 30 (*)    Nitrite POSITIVE (*)    Leukocytes,Ua MODERATE (*)    All other components within normal limits  URINALYSIS, MICROSCOPIC (REFLEX) - Abnormal; Notable for the following components:   Bacteria, UA FEW (*)    All other components within normal limits  URINE CULTURE  LIPASE, BLOOD  LACTIC ACID, PLASMA  LACTIC ACID, PLASMA  I-STAT BETA HCG BLOOD, ED ([facility], [facility], [facility] ONLY)    EKG None  Radiology Ct Renal Stone Study  Result Date:  02/12/2019 CLINICAL DATA:  Flank pain.  Possible pyelonephritis. EXAM: CT ABDOMEN AND PELVIS WITHOUT CONTRAST TECHNIQUE: Multidetector CT imaging of the abdomen and pelvis was performed following the standard protocol without IV contrast. COMPARISON:  CT abdomen pelvis  05/27/2016 FINDINGS: LOWER CHEST: There is no basilar pleural or apical pericardial effusion. HEPATOBILIARY: The hepatic contours and density are normal. There is no intra- or extrahepatic biliary dilatation. The gallbladder is normal. PANCREAS: The pancreatic parenchymal contours are normal and there is no ductal dilatation. There is no peripancreatic fluid collection. SPLEEN: Normal. ADRENALS/URINARY TRACT: --Adrenal glands: Normal. --Right kidney/ureter: The right kidney is edematous. No nephroureterolithiasis. Mild adjacent stranding within the retroperitoneal fat. No hydronephrosis. --Left kidney/ureter: No hydronephrosis, nephroureterolithiasis, perinephric stranding or solid renal mass. --Urinary bladder: Normal for degree of distention STOMACH/BOWEL: --Stomach/Duodenum: There is no hiatal hernia or other gastric abnormality. The duodenal course and caliber are normal. --Small bowel: No dilatation or inflammation. --Colon: No focal abnormality. --Appendix: Not visualized. No right lower quadrant inflammation or free fluid. VASCULAR/LYMPHATIC: Normal course and caliber of the major abdominal vessels. No abdominal or pelvic lymphadenopathy. REPRODUCTIVE: Normal uterus and ovaries. MUSCULOSKELETAL. No bony spinal canal stenosis or focal osseous abnormality. OTHER: None. IMPRESSION: 1. Edematous appearance of the right kidney, which may indicate pyelonephritis. No nephroureterolithiasis or hydronephrosis. 2. Otherwise unremarkable CT of the abdomen pelvis. Electronically Signed   By: Deatra Robinson M.D.   On: 02/12/2019 01:52    Procedures Procedures (including critical care time)  Medications Ordered in ED Medications  sodium chloride flush  (NS) 0.9 % injection 3 mL (3 mLs Intravenous Given 02/12/19 0157)  oxyCODONE-acetaminophen (PERCOCET/ROXICET) 5-325 MG per tablet 1 tablet (1 tablet Oral Given 02/11/19 2153)  sodium chloride 0.9 % bolus 1,000 mL (0 mLs Intravenous Stopped 02/12/19 0311)  cefTRIAXone (ROCEPHIN) 1 g in sodium chloride 0.9 % 100 mL IVPB (0 g Intravenous Stopped 02/12/19 0157)  morphine 4 MG/ML injection 4 mg (4 mg Intravenous Given 02/12/19 0057)  ondansetron (ZOFRAN) injection 4 mg (4 mg Intravenous Given 02/12/19 0057)  morphine 4 MG/ML injection 4 mg (4 mg Intravenous Given 02/12/19 0156)  ketorolac (TORADOL) 30 MG/ML injection 30 mg (30 mg Intravenous Given 02/12/19 0311)  oxyCODONE-acetaminophen (PERCOCET/ROXICET) 5-325 MG per tablet 1 tablet (1 tablet Oral Given 02/12/19 0310)     Initial Impression / Assessment and Plan / ED Course  I have reviewed the triage vital signs and the nursing notes.  Pertinent labs & imaging results that were available during my care of the patient were reviewed by me and considered in my medical decision making (see chart for details).        Patient presents with fever and back pain.  She is overall nontoxic-appearing and vital signs are notable for a pulse of 140.  Reports temperature at home.  History of pyelonephritis.  Other considerations include kidney stone, infected stone.  Denies any upper respiratory symptoms.  Patient was given fluids.  Urinalysis shows nitrite positive urine with positive white cells.  Urine culture was sent.  Patient was given IV Rocephin.  She does have a leukocytosis to 17.  CT scan was obtained to rule out infected stone.  CT scan shows no evidence of kidney stone but does show an edematous right kidney consistent with pyelonephritis.  There is no obvious abscess.  Lactate is normal.  Patient does not appear septic at this time.  On my reevaluation, patient is resting comfortably.  She reports the Percocet worked better for her than IV pain medication.   She has been able to tolerate fluids.  I discussed with her that a trial of outpatient antibiotic therapy is reasonable given that she does not appear septic.  I will treat her with Keflex and send  her with pain medication.  She was given strict return precautions for worsening pain, intractable fevers, nausea or vomiting.  Patient stated understanding.  After history, exam, and medical workup I feel the patient has been appropriately medically screened and is safe for discharge home. Pertinent diagnoses were discussed with the patient. Patient was given return precautions.   Final Clinical Impressions(s) / ED Diagnoses   Final diagnoses:  Pyelonephritis    ED Discharge Orders         Ordered    cephALEXin (KEFLEX) 500 MG capsule  3 times daily     02/12/19 0417    oxyCODONE-acetaminophen (PERCOCET/ROXICET) 5-325 MG tablet  Every 6 hours PRN     02/12/19 0417    ondansetron (ZOFRAN ODT) 4 MG disintegrating tablet  Every 8 hours PRN     02/12/19 0417    ibuprofen (ADVIL) 600 MG tablet  Every 6 hours PRN     02/12/19 0417           Shon Baton, MD 02/12/19 [phone]

## 2019-02-12 NOTE — Discharge Instructions (Addendum)
You were seen today and found to have pyelonephritis.  Make sure to stay hydrated at home.  Take ibuprofen for pain and fever.  Percocet for pain.  Make sure to take antibiotics as directed.  If you develop worsening pain, difficulty tolerating fluids, any new or worsening symptoms you need to be reevaluated immediately.

## 2019-02-13 LAB — URINE CULTURE: Culture: >=100000 — AB

## 2019-02-14 ENCOUNTER — Telehealth: Payer: Self-pay

## 2019-02-14 NOTE — Telephone Encounter (Signed)
Post ED Visit - Positive Culture Follow-up  Culture report reviewed by antimicrobial stewardship pharmacist: [facility] []  Enzo Bi, Pharm.D. []  Celedonio Miyamoto, Pharm.D., BCPS AQ-ID []  Garvin Fila, Pharm.D., BCPS []  Georgina Pillion, Pharm.D., BCPS []  [place], [place].D., BCPS, AAHIVP []  Estella Husk, Pharm.D., BCPS, AAHIVP []  Lysle Pearl, PharmD, BCPS []  Phillips Climes, PharmD, BCPS []  Agapito Games, PharmD, BCPS []  Verlan Friends, PharmD []  Mervyn Gay, PharmD, BCPS []  Vinnie Level, PharmD [facility] []  Len Childs, PharmD []  Greer Pickerel, PharmD []  Adalberto Cole, PharmD []  Perlie Gold, Rph []  Lonell Face) Jean Rosenthal, PharmD []  Earl Many, PharmD []  Junita Push, PharmD []  Dorna Leitz, PharmD []  Terrilee Files, PharmD []  Lynann Beaver, PharmD []  Keturah Barre, PharmD []  Loralee Pacas, PharmD []  Bernadene Person, PharmD   Positive urine culture Treated with Cephalexin, organism sensitive to the same and no further patient follow-up is required at this time.  Jerry Caras 02/14/2019, 8:30 AM

## 2020-05-10 IMAGING — CT CT RENAL STONE PROTOCOL
2 of 4 series · 16 of 46 positions shown, 18 images · non-contrast
Comparison: CT abdomen pelvis 05/27/2016

CLINICAL DATA: Flank pain.  Possible pyelonephritis.

EXAM:
CT ABDOMEN AND PELVIS WITHOUT CONTRAST
TECHNIQUE: Multidetector CT imaging of the abdomen and pelvis was performed
following the standard protocol without IV contrast.

[Series 3: ap without · axial · non-contrast · 0.89mm/px · z∈[+801,+1251]mm · 13 of 104 slices shown, 15 images]
[im 7/104  soft-tissue]
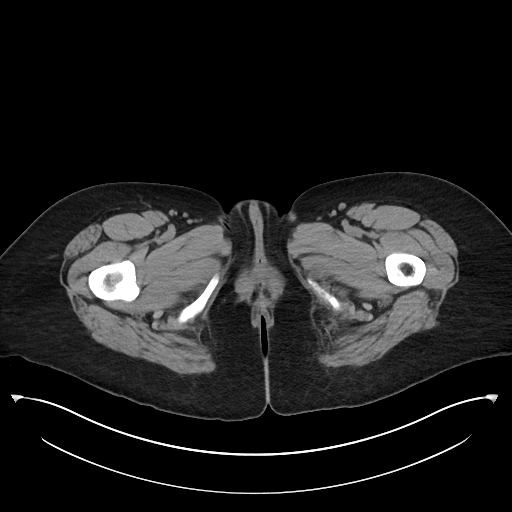
[im 7/104  bone]
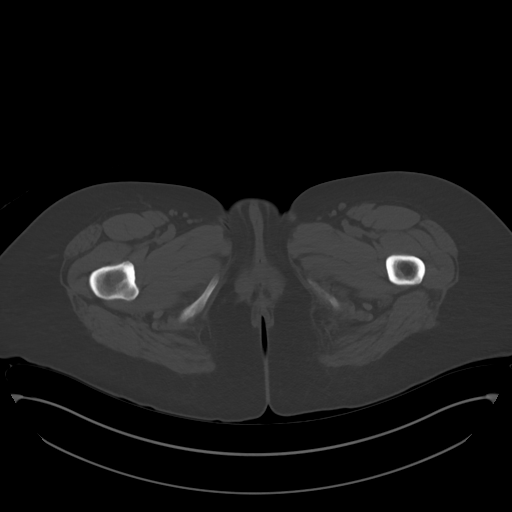
[im 13/104  soft-tissue]
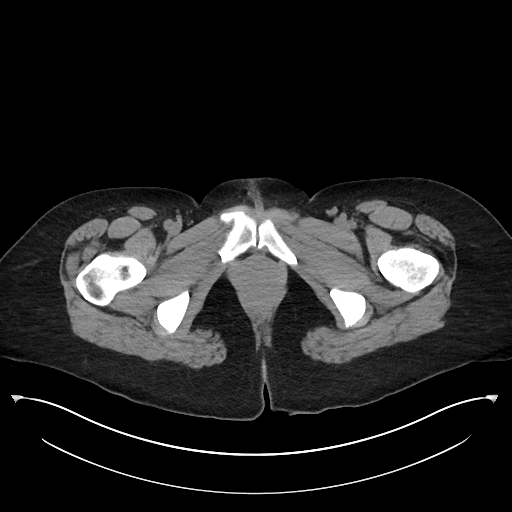
[im 25/104  soft-tissue]
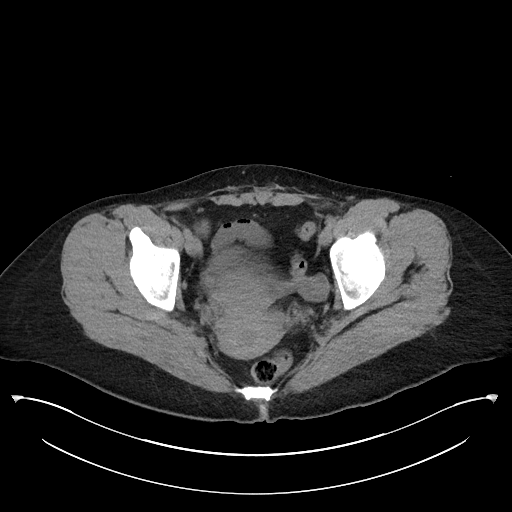
[im 31/104  soft-tissue]
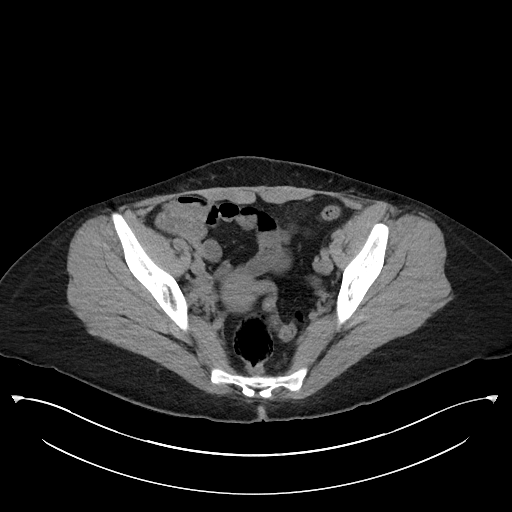
[im 37/104  soft-tissue]
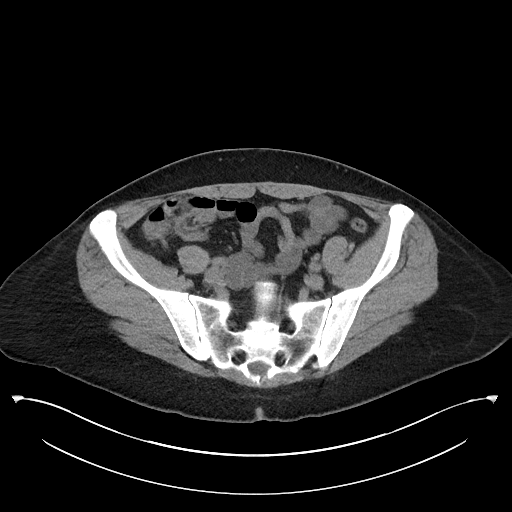
[im 43/104  soft-tissue]
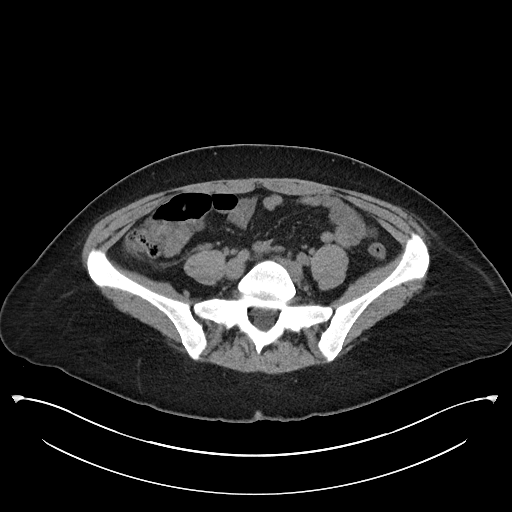
[im 55/104  soft-tissue]
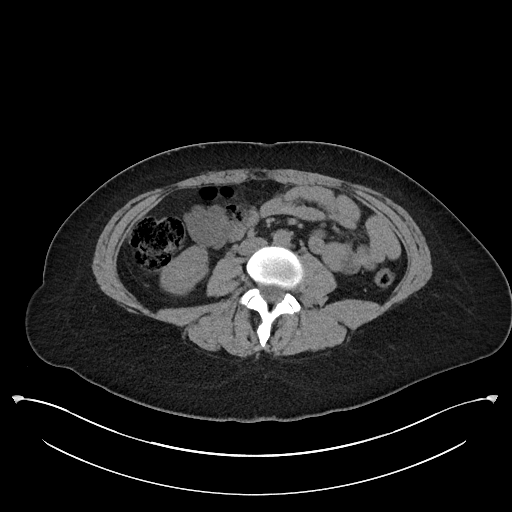
[im 61/104  soft-tissue]
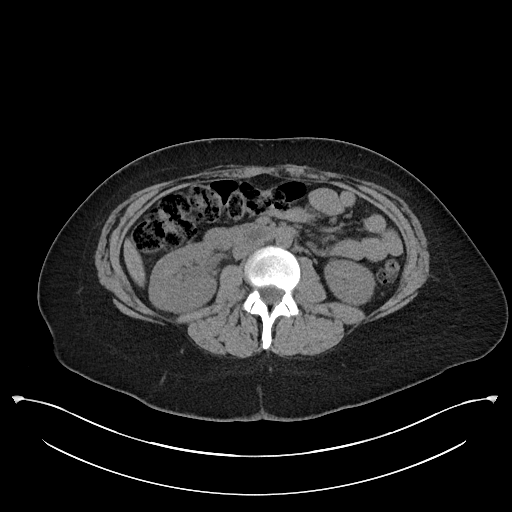
[im 67/104  soft-tissue]
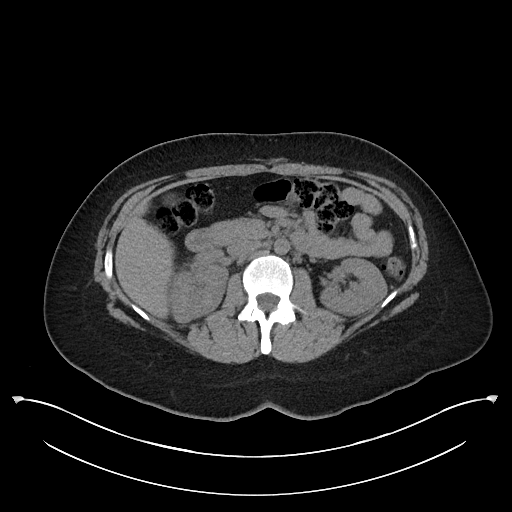
[im 67/104  bone]
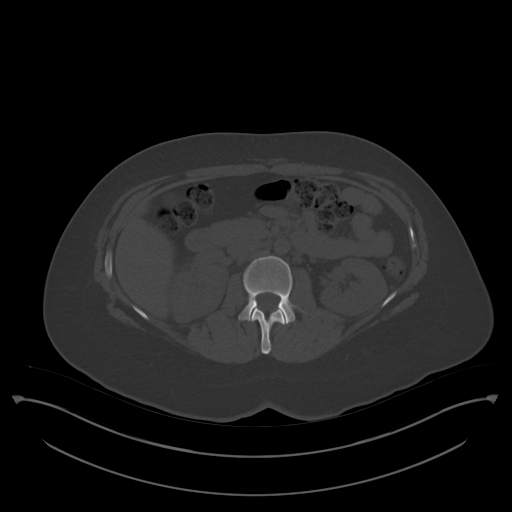
[im 73/104  soft-tissue]
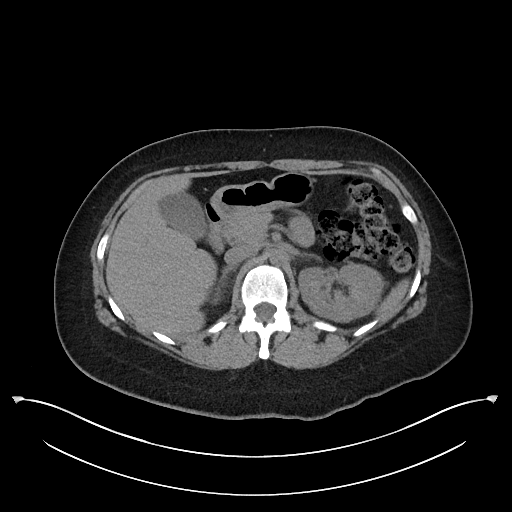
[im 79/104  soft-tissue]
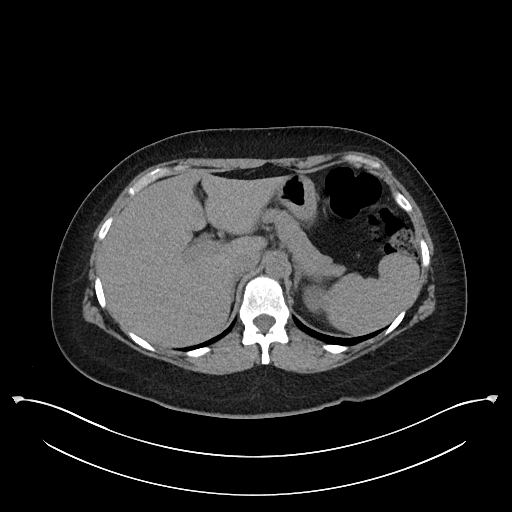
[im 91/104  soft-tissue]
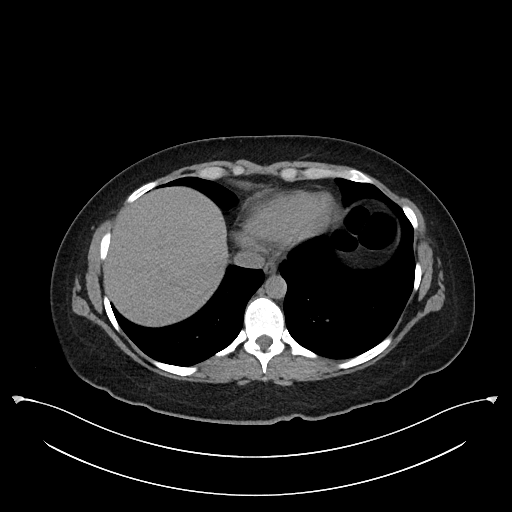
[im 97/104  soft-tissue]
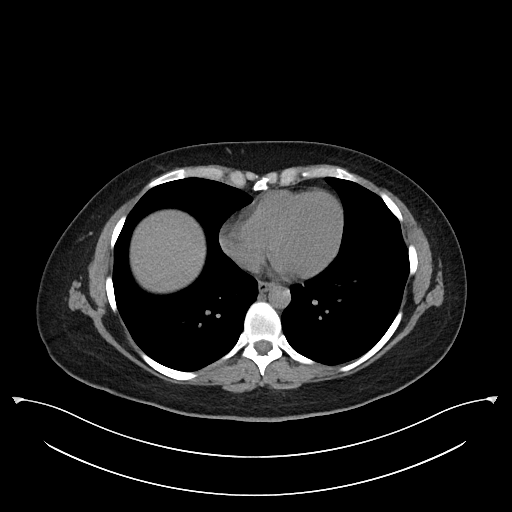

[Series 6: cor · coronal · 0.78mm/px · 3 of 94 slices shown]
[im 32/94  soft-tissue]
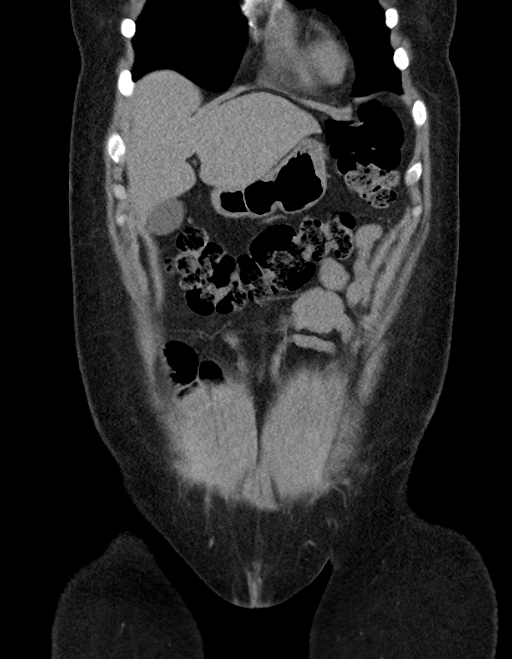
[im 42/94  soft-tissue]
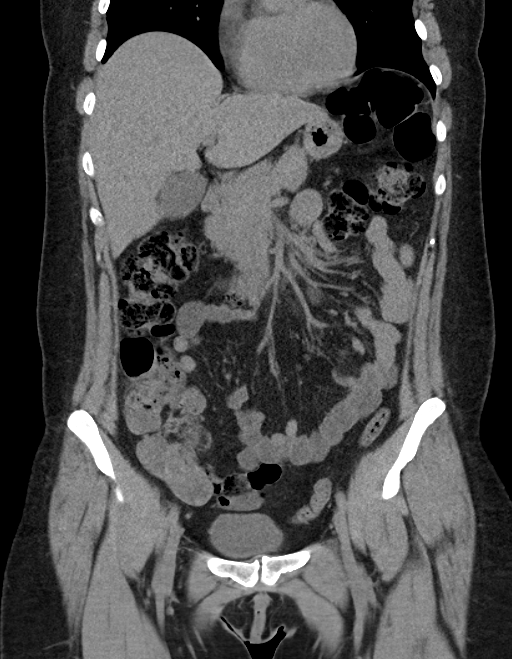
[im 52/94  soft-tissue]
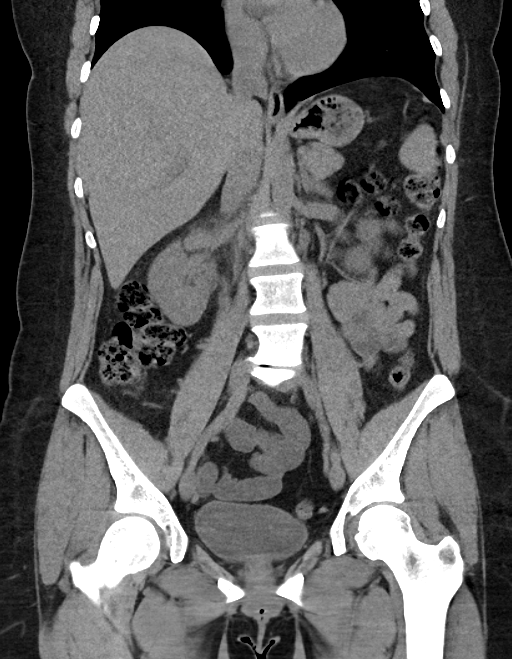

[16 of 46 positions shown; findings below may reference images not displayed]

FINDINGS: LOWER CHEST: There is no basilar pleural or apical pericardial
effusion.

HEPATOBILIARY: The hepatic contours and density are normal. There is
no intra- or extrahepatic biliary dilatation. The gallbladder is
normal.

PANCREAS: The pancreatic parenchymal contours are normal and there
is no ductal dilatation. There is no peripancreatic fluid
collection.

SPLEEN: Normal.

ADRENALS/URINARY TRACT:

--Adrenal glands: Normal.

--Right kidney/ureter: The right kidney is edematous. No
nephroureterolithiasis. Mild adjacent stranding within the
retroperitoneal fat. No hydronephrosis.

--Left kidney/ureter: No hydronephrosis, nephroureterolithiasis,
perinephric stranding or solid renal mass.

--Urinary bladder: Normal for degree of distention

STOMACH/BOWEL:

--Stomach/Duodenum: There is no hiatal hernia or other gastric
abnormality. The duodenal course and caliber are normal.

--Small bowel: No dilatation or inflammation.

--Colon: No focal abnormality.

--Appendix: Not visualized. No right lower quadrant inflammation or
free fluid.

VASCULAR/LYMPHATIC: Normal course and caliber of the major abdominal
vessels. No abdominal or pelvic lymphadenopathy.

REPRODUCTIVE: Normal uterus and ovaries.

MUSCULOSKELETAL. No bony spinal canal stenosis or focal osseous
abnormality.

OTHER: None.
IMPRESSION: 1. Edematous appearance of the right kidney, which may indicate
pyelonephritis. No nephroureterolithiasis or hydronephrosis.
2. Otherwise unremarkable CT of the abdomen pelvis.
